# Patient Record
Sex: Female | Born: 1995 | Race: Black or African American | Hispanic: No | Marital: Single | Smoking: Never smoker
Health system: Southern US, Community
[De-identification: ages and names within clinical notes are randomized; demographics above are authoritative.]

---

## 2013-12-04 ENCOUNTER — Encounter (HOSPITAL_COMMUNITY): Payer: Self-pay | Admitting: Emergency Medicine

## 2013-12-04 DIAGNOSIS — N390 Urinary tract infection, site not specified: Secondary | ICD-10-CM | POA: Insufficient documentation

## 2013-12-04 DIAGNOSIS — Z3202 Encounter for pregnancy test, result negative: Secondary | ICD-10-CM | POA: Insufficient documentation

## 2013-12-04 LAB — CBC WITH DIFFERENTIAL/PLATELET
BASOS PCT: 0 % (ref 0–1)
Basophils Absolute: 0 10*3/uL (ref 0.0–0.1)
EOS ABS: 0.1 10*3/uL (ref 0.0–0.7)
EOS PCT: 2 % (ref 0–5)
HCT: 41.9 % (ref 36.0–46.0)
Hemoglobin: 13.6 g/dL (ref 12.0–15.0)
Lymphocytes Relative: 29 % (ref 12–46)
Lymphs Abs: 2.2 10*3/uL (ref 0.7–4.0)
MCH: 29.1 pg (ref 26.0–34.0)
MCHC: 32.5 g/dL (ref 30.0–36.0)
MCV: 89.7 fL (ref 78.0–100.0)
Monocytes Absolute: 0.4 10*3/uL (ref 0.1–1.0)
Monocytes Relative: 5 % (ref 3–12)
NEUTROS PCT: 64 % (ref 43–77)
Neutro Abs: 5.1 10*3/uL (ref 1.7–7.7)
Platelets: 338 10*3/uL (ref 150–400)
RBC: 4.67 MIL/uL (ref 3.87–5.11)
RDW: 12.9 % (ref 11.5–15.5)
WBC: 7.8 10*3/uL (ref 4.0–10.5)

## 2013-12-04 LAB — URINALYSIS, ROUTINE W REFLEX MICROSCOPIC
Bilirubin Urine: NEGATIVE
Glucose, UA: NEGATIVE mg/dL
Ketones, ur: NEGATIVE mg/dL
Nitrite: NEGATIVE
Protein, ur: NEGATIVE mg/dL
SPECIFIC GRAVITY, URINE: 1.03 (ref 1.005–1.030)
UROBILINOGEN UA: 1 mg/dL (ref 0.0–1.0)
pH: 6 (ref 5.0–8.0)

## 2013-12-04 LAB — URINE MICROSCOPIC-ADD ON

## 2013-12-04 LAB — PREGNANCY, URINE: Preg Test, Ur: NEGATIVE

## 2013-12-04 NOTE — ED Notes (Signed)
Pt. reports left lateral abdominal pain onset 2 months ago with slight intermittent lightheadedness. No nausea/vomitting or diarrhea. Denies urinary discomfort .

## 2013-12-05 ENCOUNTER — Encounter (HOSPITAL_COMMUNITY): Payer: Self-pay | Admitting: Radiology

## 2013-12-05 ENCOUNTER — Emergency Department (HOSPITAL_COMMUNITY)
Admission: EM | Admit: 2013-12-05 | Discharge: 2013-12-05 | Disposition: A | Discharge status: Home / Self Care | Payer: BC Managed Care – PPO | Attending: Emergency Medicine | Admitting: Emergency Medicine

## 2013-12-05 ENCOUNTER — Emergency Department (HOSPITAL_COMMUNITY): Payer: BC Managed Care – PPO

## 2013-12-05 DIAGNOSIS — R109 Unspecified abdominal pain: Secondary | ICD-10-CM

## 2013-12-05 DIAGNOSIS — N390 Urinary tract infection, site not specified: Secondary | ICD-10-CM

## 2013-12-05 LAB — COMPREHENSIVE METABOLIC PANEL
ALBUMIN: 3.8 g/dL (ref 3.5–5.2)
ALK PHOS: 64 U/L (ref 39–117)
ALT: 13 U/L (ref 0–35)
AST: 19 U/L (ref 0–37)
BUN: 13 mg/dL (ref 6–23)
CALCIUM: 9.4 mg/dL (ref 8.4–10.5)
CO2: 25 mEq/L (ref 19–32)
Chloride: 102 mEq/L (ref 96–112)
Creatinine, Ser: 0.92 mg/dL (ref 0.50–1.10)
GFR calc Af Amer: >90 mL/min (ref >90)
GFR calc non Af Amer: 90 mL/min — ABNORMAL LOW (ref >90)
Glucose, Bld: 86 mg/dL (ref 70–99)
POTASSIUM: 3.8 meq/L (ref 3.7–5.3)
SODIUM: 139 meq/L (ref 137–147)
Total Bilirubin: 0.3 mg/dL (ref 0.3–1.2)
Total Protein: 8.4 g/dL — ABNORMAL HIGH (ref 6.0–8.3)

## 2013-12-05 LAB — I-STAT CHEM 8, ED
BUN: 12 mg/dL (ref 6–23)
CALCIUM ION: 1.17 mmol/L (ref 1.12–1.23)
CHLORIDE: 101 meq/L (ref 96–112)
CREATININE: 1 mg/dL (ref 0.50–1.10)
GLUCOSE: 81 mg/dL (ref 70–99)
HCT: 43 % (ref 36.0–46.0)
Hemoglobin: 14.6 g/dL (ref 12.0–15.0)
Potassium: 3.7 mEq/L (ref 3.7–5.3)
Sodium: 143 mEq/L (ref 137–147)
TCO2: 24 mmol/L (ref 0–100)

## 2013-12-05 LAB — LIPASE, BLOOD: Lipase: 18 U/L (ref 11–59)

## 2013-12-05 MED ORDER — CEPHALEXIN 500 MG PO CAPS: 500.0000 mg | ORAL_CAPSULE | Freq: Four times a day (QID) | ORAL | Status: AC

## 2013-12-05 MED ORDER — ONDANSETRON HCL 4 MG/2ML IJ SOLN
4.0000 mg | Freq: Once | INTRAMUSCULAR | Status: AC
  Administered 2013-12-05: 4 mg via INTRAVENOUS
  Filled 2013-12-05: qty 2

## 2013-12-05 MED ORDER — ONDANSETRON HCL 4 MG PO TABS: 4.0000 mg | ORAL_TABLET | Freq: Four times a day (QID) | ORAL | Status: AC

## 2013-12-05 MED ORDER — SODIUM CHLORIDE 0.9 % IV BOLUS (SEPSIS)
1000.0000 mL | Freq: Once | INTRAVENOUS | Status: AC
  Administered 2013-12-05: 1000 mL via INTRAVENOUS

## 2013-12-05 MED ORDER — DEXTROSE 5 % IV SOLN
1.0000 g | Freq: Once | INTRAVENOUS | Status: AC
  Administered 2013-12-05: 1 g via INTRAVENOUS
  Filled 2013-12-05: qty 10

## 2013-12-05 MED ORDER — IBUPROFEN 800 MG PO TABS: 800.0000 mg | ORAL_TABLET | Freq: Three times a day (TID) | ORAL | Status: AC

## 2013-12-05 MED ORDER — FENTANYL CITRATE 0.05 MG/ML IJ SOLN
50.0000 ug | INTRAMUSCULAR | Status: DC | PRN
  Administered 2013-12-05: 50 ug via INTRAVENOUS
  Filled 2013-12-05: qty 2

## 2013-12-05 NOTE — Discharge Instructions (Signed)
Abdominal Pain, Adult °Many things can cause abdominal pain. Usually, abdominal pain is not caused by a disease and will improve without treatment. It can often be observed and treated at home. Your health care provider will do a physical exam and possibly order blood tests and X-rays to help determine the seriousness of your pain. However, in many cases, more time must pass before a clear cause of the pain can be found. Before that point, your health care provider may not know if you need more testing or further treatment. °HOME CARE INSTRUCTIONS  °Monitor your abdominal pain for any changes. The following actions may help to alleviate any discomfort you are experiencing: °· Only take over-the-counter or prescription medicines as directed by your health care provider. °· Do not take laxatives unless directed to do so by your health care provider. °· Try a clear liquid diet (broth, tea, or water) as directed by your health care provider. Slowly move to a bland diet as tolerated. °SEEK MEDICAL CARE IF: °· You have unexplained abdominal pain. °· You have abdominal pain associated with nausea or diarrhea. °· You have pain when you urinate or have a bowel movement. °· You experience abdominal pain that wakes you in the night. °· You have abdominal pain that is worsened or improved by eating food. °· You have abdominal pain that is worsened with eating fatty foods. °SEEK IMMEDIATE MEDICAL CARE IF:  °· Your pain does not go away within 2 hours. °· You have a fever. °· You keep throwing up (vomiting). °· Your pain is felt only in portions of the abdomen, such as the right side or the left lower portion of the abdomen. °· You pass bloody or black tarry stools. °MAKE SURE YOU: °· Understand these instructions.   °· Will watch your condition.   °· Will get help right away if you are not doing well or get worse.   °Document Released: 05/11/2005 Document Revised: 05/22/2013 Document Reviewed: 04/10/2013 °[employer]® Patient  Information ©2014 [employer]. ° °

## 2013-12-05 NOTE — ED Notes (Signed)
Patient transported to CT 

## 2013-12-05 NOTE — ED Notes (Signed)
2nd RN unable to obtain IV access. IV team paged. Md notified.

## 2013-12-05 NOTE — ED Notes (Signed)
Patient reports left flank pain, and also rib pain she has had SOB but denies SOB at this time.  She advises me that she has been having this pain for a "while".  The reason she came in today is because her pain got worse.

## 2013-12-05 NOTE — ED Provider Notes (Signed)
CSN: [id number]     Arrival date & time 12/04/13  2215 History   First MD Initiated Contact with Patient 12/05/13 0235     Chief Complaint  Patient presents with  . Abdominal Pain     (Consider location/radiation/quality/duration/timing/severity/associated sxs/prior Treatment) HPI History per patient. Left flank pain. Onset a few months ago has been mild. Tonight became more severe, sharp in quality without radiation. No associated fever chills. No nausea vomiting. No hematuria, urgency or frequency. No dysuria. No known aggravating or alleviating factors. Patient requesting blood work because her father died at age 20 on dialysis - she is not certain what the etiology of his kidney problems were -  she is worried that it may be a hereditary issue.  History reviewed. No pertinent past medical history. History reviewed. No pertinent past surgical history. No family history on file. History  Substance Use Topics  . Smoking status: Never Smoker   . Smokeless tobacco: Not on file  . Alcohol Use: Yes   OB History   Grav Para Term Preterm Abortions TAB SAB Ect Mult Living                 Review of Systems  Constitutional: Negative for fever and chills.  Respiratory: Negative for shortness of breath.   Cardiovascular: Negative for chest pain.  Gastrointestinal: Positive for abdominal pain. Negative for nausea and vomiting.  Genitourinary: Positive for flank pain. Negative for dysuria.  Musculoskeletal: Negative for back pain and neck pain.  Skin: Negative for rash.  Neurological: Negative for headaches.  All other systems reviewed and are negative.     Allergies  Review of patient's allergies indicates no known allergies.  Home Medications   Prior to Admission medications   Medication Sig Start Date End Date Taking? Authorizing Provider  diphenhydrAMINE (BENADRYL) 25 MG tablet Take 25 mg by mouth every 6 (six) hours as needed for allergies.   Yes Historical Provider, MD    BP 138/96  Pulse 87  Temp(Src) 98.4 F (36.9 C) (Oral)  Resp 18  SpO2 100%  LMP 11/26/2013 Physical Exam  Constitutional: She is oriented to person, place, and time. She appears well-developed and well-nourished.  HENT:  Head: Normocephalic and atraumatic.  Eyes: EOM are normal. Pupils are equal, round, and reactive to light.  Neck: Neck supple.  Cardiovascular: Normal rate, regular rhythm and intact distal pulses.   Pulmonary/Chest: Effort normal and breath sounds normal. No respiratory distress.  Abdominal: Soft. Bowel sounds are normal. She exhibits no distension and no mass.  tenderness to left upper quadrant and left flank region. No CVA tenderness. No rebound or guarding.  Musculoskeletal: Normal range of motion. She exhibits no edema.  Neurological: She is alert and oriented to person, place, and time.  Skin: Skin is warm and dry.    ED Course  Procedures (including critical care time) Labs Review Labs Reviewed  URINALYSIS, ROUTINE W REFLEX MICROSCOPIC - Abnormal; Notable for the following:    APPearance CLOUDY (*)    Hgb urine dipstick LARGE (*)    Leukocytes, UA MODERATE (*)    All other components within normal limits  URINE MICROSCOPIC-ADD ON - Abnormal; Notable for the following:    Squamous Epithelial / LPF FEW (*)    Bacteria, UA FEW (*)    All other components within normal limits  CBC WITH DIFFERENTIAL  PREGNANCY, URINE  COMPREHENSIVE METABOLIC PANEL  LIPASE, BLOOD    Imaging Review Ct Abdomen Pelvis Wo Contrast  12/05/2013  CLINICAL DATA:  Left lateral abdominal pain onset 2 months ago. Left flank pain.  EXAM: CT ABDOMEN AND PELVIS WITHOUT CONTRAST  TECHNIQUE: Multidetector CT imaging of the abdomen and pelvis was performed following the standard protocol without intravenous contrast.  COMPARISON:  None.  FINDINGS: Lung bases are clear. The kidneys appear symmetrical. No pyelocaliectasis or ureterectasis. No renal, ureteral, or bladder stones are  visualized. The bladder wall is not thickened  The unenhanced appearance of the liver, spleen, gallbladder, pancreas, adrenal glands, kidneys, abdominal aorta, inferior vena cava, and retroperitoneal lymph nodes is unremarkable. The stomach and small bowel are decompressed. Stool-filled colon without distention. No free air or free fluid in the abdomen. Abdominal wall musculature appears intact.  Pelvis: Uterus and ovaries are not enlarged. No free or loculated pelvic fluid collections. No pelvic mass or lymphadenopathy. The appendix is normal. No destructive bone lesions.  IMPRESSION: No renal or ureteral stone or obstruction.   Electronically Signed   By: Burman NievesWilliam  Stevens M.D.   On: 12/05/2013 03:59   IV fluids. IV Rocephin. IV Zofran. IV fentanyl.  Pain improved. Reassurance provided.  Plan discharge home with prescription for pain medications, antibiotics.  Outpatient referral provided. Strict return precautions verbalized is understood.   MDM   Diagnosis: Left flank pain, UTI  Patient presents with worsening ongoing left flank pain, concerned about her kidneys.  Her father died at age 20 and was on dialysis. She was evaluated with labs and imaging urinalysis reviewed as above. Treated for UTI. Pain improved with IV narcotics. Vital signs and nursing notes reviewed and considered. She has no acute abdomen no indication for further workup in the emergency room at this time. She is agreeable to all discharge and followup instructions.    Sunnie NielsenBrian Dejaun Vidrio, MD 12/05/13 [phone]

## 2014-09-13 IMAGING — CT CT ABD-PELV W/O CM
2 of 4 series · 16 of 46 positions shown, 18 images · non-contrast
Comparison: None.

CLINICAL DATA: Left lateral abdominal pain onset 2 months ago. Left
flank pain.

EXAM:
CT ABDOMEN AND PELVIS WITHOUT CONTRAST
TECHNIQUE: Multidetector CT imaging of the abdomen and pelvis was performed
following the standard protocol without intravenous contrast.

[Series 2: stone study 5.0 i30f 1 · axial · 0.68mm/px · z∈[+968,+1388]mm · 13 of 92 slices shown, 15 images]
[im 4/92  soft-tissue]
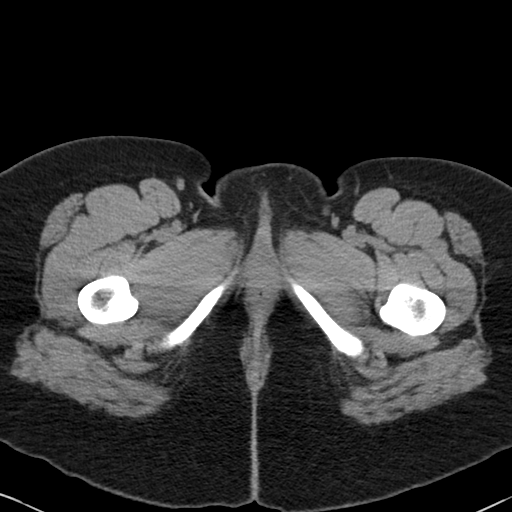
[im 4/92  bone]
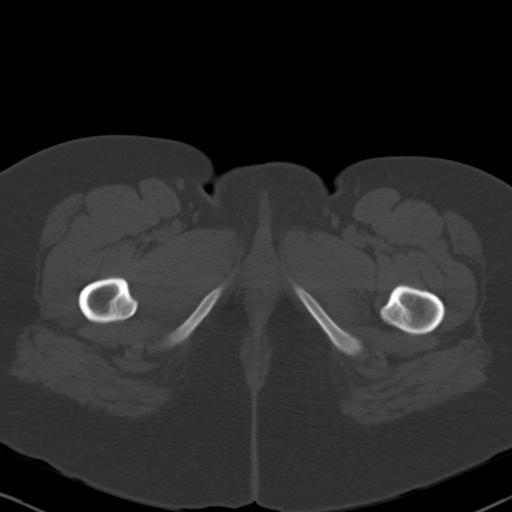
[im 12/92  soft-tissue]
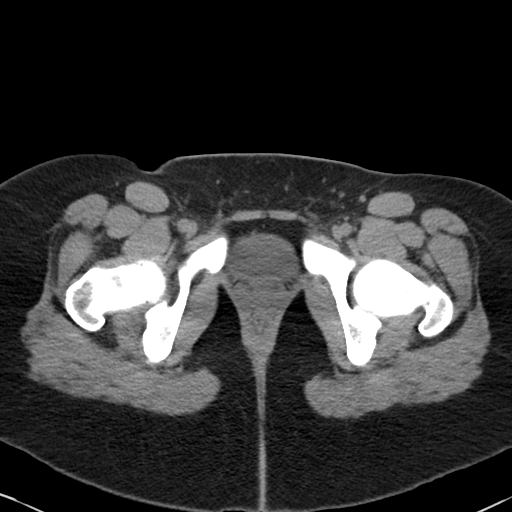
[im 19/92  soft-tissue]
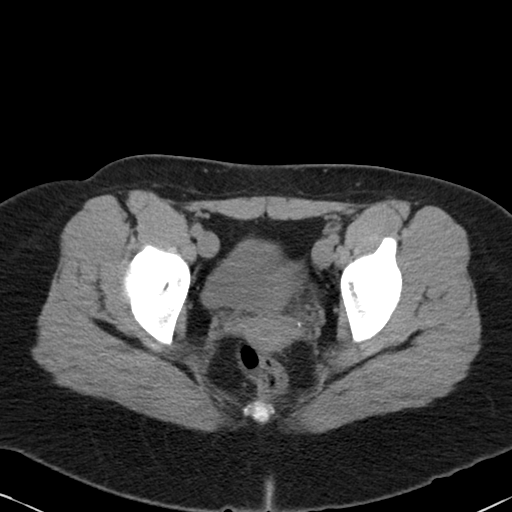
[im 27/92  soft-tissue]
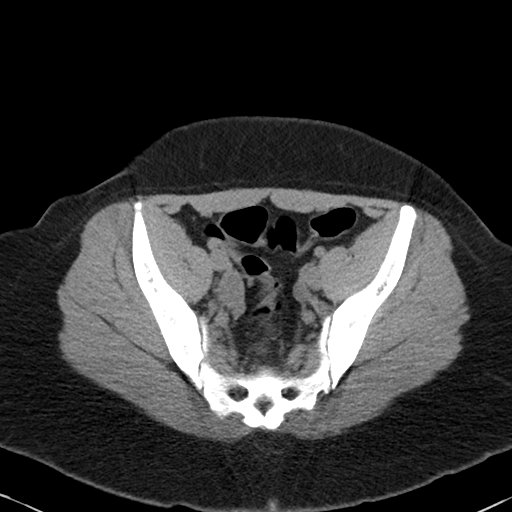
[im 31/92  soft-tissue]
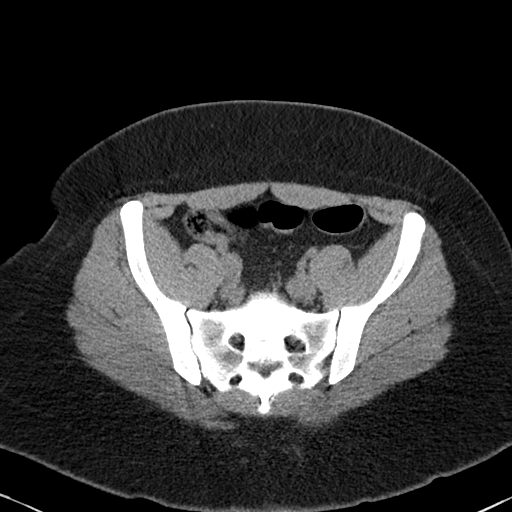
[im 38/92  soft-tissue]
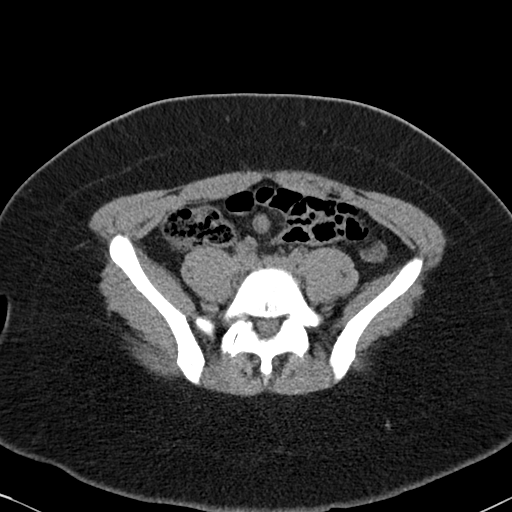
[im 46/92  soft-tissue]
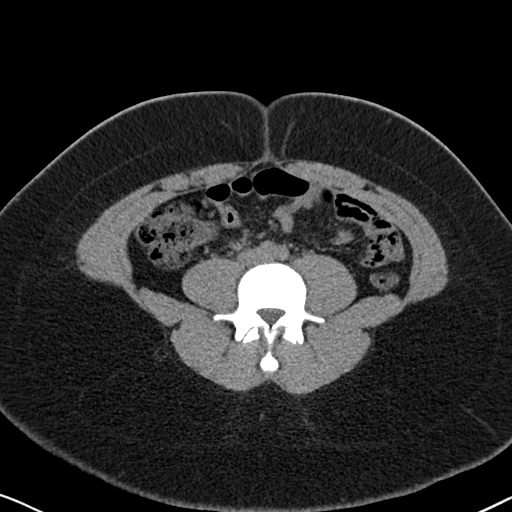
[im 54/92  soft-tissue]
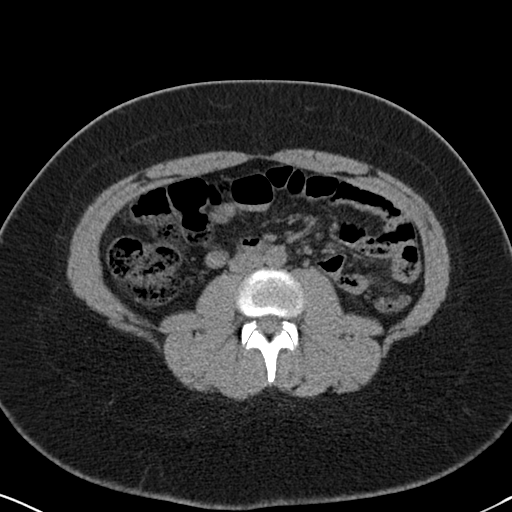
[im 61/92  soft-tissue]
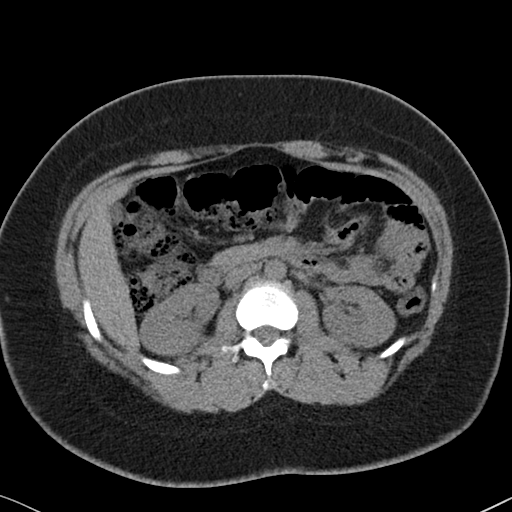
[im 61/92  bone]
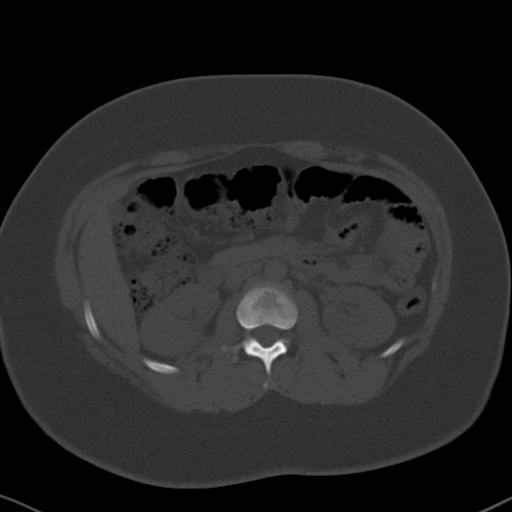
[im 65/92  soft-tissue]
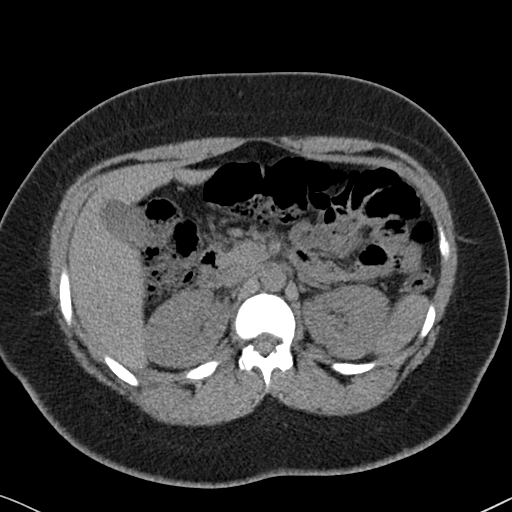
[im 73/92  soft-tissue]
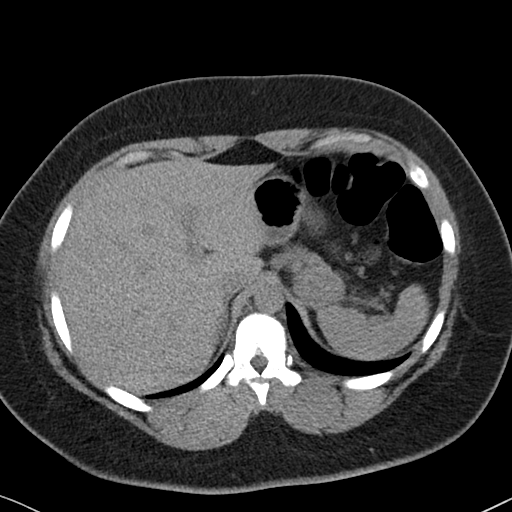
[im 80/92  soft-tissue]
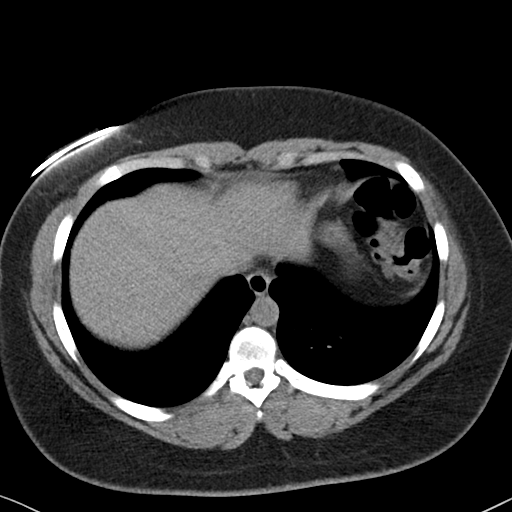
[im 88/92  soft-tissue]
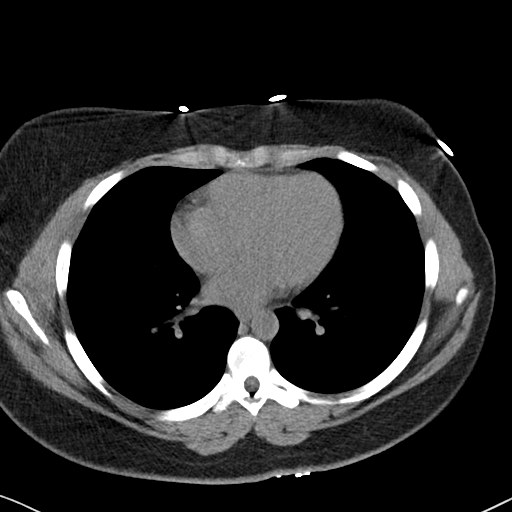

[Series 5: coronal soft tissue · coronal · 0.76mm/px · 3 of 69 slices shown]
[im 23/69  soft-tissue]
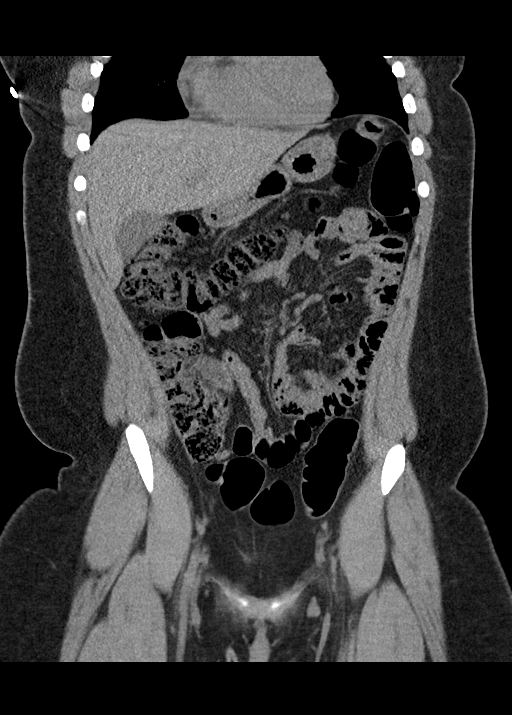
[im 31/69  soft-tissue]
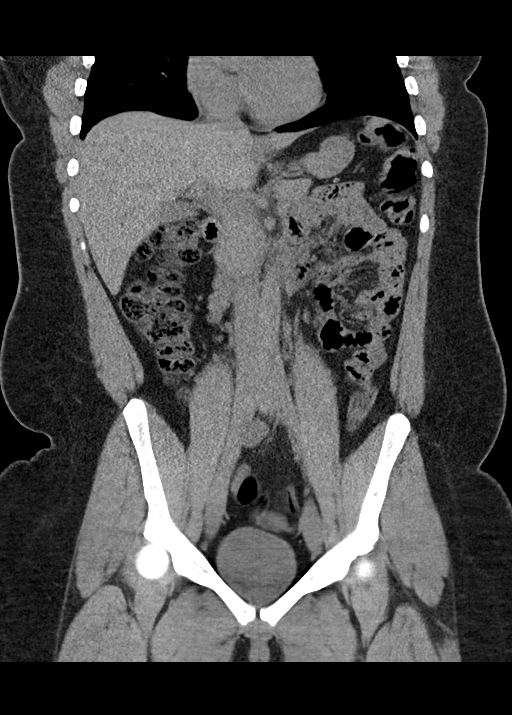
[im 38/69  soft-tissue]
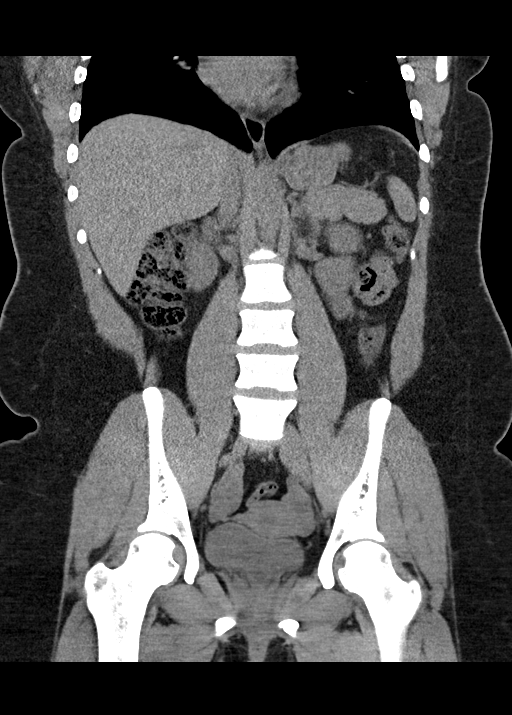

[16 of 46 positions shown; findings below may reference images not displayed]

FINDINGS: Lung bases are clear. The kidneys appear symmetrical. No
pyelocaliectasis or ureterectasis. No renal, ureteral, or bladder
stones are visualized. The bladder wall is not thickened

The unenhanced appearance of the liver, spleen, gallbladder,
pancreas, adrenal glands, kidneys, abdominal aorta, inferior vena
cava, and retroperitoneal lymph nodes is unremarkable. The stomach
and small bowel are decompressed. Stool-filled colon without
distention. No free air or free fluid in the abdomen. Abdominal wall
musculature appears intact.

Pelvis: Uterus and ovaries are not enlarged. No free or loculated
pelvic fluid collections. No pelvic mass or lymphadenopathy. The
appendix is normal. No destructive bone lesions.
IMPRESSION: No renal or ureteral stone or obstruction.
